# Patient Record
Sex: Male | Born: 1970 | Race: White | Hispanic: No | Marital: Single | Smoking: Never smoker
Health system: Southern US, Community
[De-identification: ages and names within clinical notes are randomized; demographics above are authoritative.]

## PROBLEM LIST (undated history)

## (undated) DIAGNOSIS — R42 Dizziness and giddiness: Principal | ICD-10-CM

## (undated) DIAGNOSIS — G610 Guillain-Barre syndrome: Secondary | ICD-10-CM

## (undated) DIAGNOSIS — F39 Unspecified mood [affective] disorder: Secondary | ICD-10-CM

## (undated) DIAGNOSIS — R51 Headache: Secondary | ICD-10-CM

## (undated) HISTORY — DX: Headache: R51

## (undated) HISTORY — DX: Guillain-Barre syndrome: G61.0

## (undated) HISTORY — PX: HERNIA REPAIR: SHX51

## (undated) HISTORY — DX: Dizziness and giddiness: R42

---

## 2001-11-17 ENCOUNTER — Encounter: Admission: RE | Admit: 2001-11-17 | Discharge: 2001-11-17 | Payer: Self-pay | Admitting: Neurosurgery

## 2001-11-17 ENCOUNTER — Encounter: Payer: Self-pay | Admitting: Neurosurgery

## 2001-12-01 ENCOUNTER — Encounter: Payer: Self-pay | Admitting: Neurosurgery

## 2001-12-01 ENCOUNTER — Encounter: Admission: RE | Admit: 2001-12-01 | Discharge: 2001-12-01 | Payer: Self-pay | Admitting: Neurosurgery

## 2002-01-17 ENCOUNTER — Encounter: Payer: Self-pay | Admitting: Neurosurgery

## 2002-01-17 ENCOUNTER — Encounter: Admission: RE | Admit: 2002-01-17 | Discharge: 2002-01-17 | Payer: Self-pay | Admitting: Neurosurgery

## 2004-10-09 ENCOUNTER — Encounter: Admission: RE | Admit: 2004-10-09 | Discharge: 2004-10-09 | Payer: Self-pay | Admitting: Neurosurgery

## 2004-11-04 ENCOUNTER — Encounter: Admission: RE | Admit: 2004-11-04 | Discharge: 2004-11-04 | Payer: Self-pay | Admitting: Neurosurgery

## 2015-02-16 ENCOUNTER — Emergency Department (HOSPITAL_COMMUNITY)
Admission: EM | Admit: 2015-02-16 | Discharge: 2015-02-17 | Disposition: A | Discharge status: Home / Self Care | Payer: BLUE CROSS/BLUE SHIELD | Attending: Emergency Medicine | Admitting: Emergency Medicine

## 2015-02-16 ENCOUNTER — Emergency Department (HOSPITAL_COMMUNITY): Payer: BLUE CROSS/BLUE SHIELD

## 2015-02-16 ENCOUNTER — Encounter (HOSPITAL_COMMUNITY): Payer: Self-pay | Admitting: *Deleted

## 2015-02-16 DIAGNOSIS — R42 Dizziness and giddiness: Secondary | ICD-10-CM

## 2015-02-16 DIAGNOSIS — Z79899 Other long term (current) drug therapy: Secondary | ICD-10-CM | POA: Diagnosis not present

## 2015-02-16 DIAGNOSIS — R079 Chest pain, unspecified: Secondary | ICD-10-CM | POA: Insufficient documentation

## 2015-02-16 HISTORY — DX: Unspecified mood (affective) disorder: F39

## 2015-02-16 LAB — CBC
HCT: 44.4 % (ref 39.0–52.0)
HEMOGLOBIN: 16 g/dL (ref 13.0–17.0)
MCH: 32.7 pg (ref 26.0–34.0)
MCHC: 36 g/dL (ref 30.0–36.0)
MCV: 90.6 fL (ref 78.0–100.0)
PLATELETS: 177 10*3/uL (ref 150–400)
RBC: 4.9 MIL/uL (ref 4.22–5.81)
RDW: 12.6 % (ref 11.5–15.5)
WBC: 8.7 10*3/uL (ref 4.0–10.5)

## 2015-02-16 LAB — URINALYSIS, ROUTINE W REFLEX MICROSCOPIC
Bilirubin Urine: NEGATIVE
Glucose, UA: NEGATIVE mg/dL
HGB URINE DIPSTICK: NEGATIVE
KETONES UR: NEGATIVE mg/dL
Leukocytes, UA: NEGATIVE
Nitrite: NEGATIVE
PROTEIN: NEGATIVE mg/dL
Specific Gravity, Urine: 1.012 (ref 1.005–1.030)
UROBILINOGEN UA: 0.2 mg/dL (ref 0.0–1.0)
pH: 6 (ref 5.0–8.0)

## 2015-02-16 LAB — CBG MONITORING, ED: GLUCOSE-CAPILLARY: 113 mg/dL — AB (ref 65–99)

## 2015-02-16 LAB — BASIC METABOLIC PANEL
ANION GAP: 8 (ref 5–15)
BUN: 15 mg/dL (ref 6–20)
CALCIUM: 9.4 mg/dL (ref 8.9–10.3)
CO2: 25 mmol/L (ref 22–32)
CREATININE: 0.99 mg/dL (ref 0.61–1.24)
Chloride: 105 mmol/L (ref 101–111)
GLUCOSE: 111 mg/dL — AB (ref 65–99)
Potassium: 4.3 mmol/L (ref 3.5–5.1)
Sodium: 138 mmol/L (ref 135–145)

## 2015-02-16 LAB — I-STAT TROPONIN, ED: Troponin i, poc: 0 ng/mL (ref 0.00–0.08)

## 2015-02-16 MED ORDER — DIAZEPAM 5 MG/ML IJ SOLN: 5.0000 mg | Freq: Once | INTRAMUSCULAR | Status: DC

## 2015-02-16 MED ORDER — MECLIZINE HCL 25 MG PO TABS
25.0000 mg | ORAL_TABLET | Freq: Once | ORAL | Status: DC
  Filled 2015-02-16: qty 1

## 2015-02-16 MED ORDER — SODIUM CHLORIDE 0.9 % IV BOLUS (SEPSIS)
500.0000 mL | Freq: Once | INTRAVENOUS | Status: AC
  Administered 2015-02-16: 500 mL via INTRAVENOUS

## 2015-02-16 MED ORDER — METHYLPREDNISOLONE SODIUM SUCC 125 MG IJ SOLR
125.0000 mg | Freq: Once | INTRAMUSCULAR | Status: AC
  Administered 2015-02-16: 125 mg via INTRAVENOUS
  Filled 2015-02-16: qty 2

## 2015-02-16 MED ORDER — LORAZEPAM 2 MG/ML IJ SOLN
1.0000 mg | Freq: Once | INTRAMUSCULAR | Status: AC
  Administered 2015-02-16: 1 mg via INTRAVENOUS
  Filled 2015-02-16: qty 1

## 2015-02-16 MED ORDER — ONDANSETRON 4 MG PO TBDP
4.0000 mg | ORAL_TABLET | Freq: Once | ORAL | Status: DC
  Filled 2015-02-16: qty 1

## 2015-02-16 MED ORDER — METOCLOPRAMIDE HCL 5 MG/ML IJ SOLN
10.0000 mg | Freq: Once | INTRAMUSCULAR | Status: AC
  Administered 2015-02-16: 10 mg via INTRAVENOUS
  Filled 2015-02-16: qty 2

## 2015-02-16 NOTE — ED Notes (Signed)
CBG was 113.

## 2015-02-16 NOTE — ED Notes (Signed)
PT has been treated by his pcp for vertigo.  He has had 3 significant episodes of dizziness lasting approximately 3 hours.  It seems like the episodes happen after he eats, then lays down to work under his car.  He begins to feel like the room is spinning, everything goes black for about 20 sec, and then if he lays still the dizziness slowly goes away.  Today he experienced bil lat rib pain and sternal chet pain he describes cramping. The has been taking antivert x 1 day and has not taken the lisinopril he was prescribed b/c he just bought it yesterday.  Pt has taken a total of 6  asa.  EMS stated initial pressure was 200/120 but then decereased to 155/92.

## 2015-02-17 MED ORDER — METOCLOPRAMIDE HCL 10 MG PO TABS
ORAL_TABLET | ORAL | Status: DC
Start: 2015-02-17 — End: 2015-04-29

## 2015-02-17 MED ORDER — LORAZEPAM 1 MG PO TABS: ORAL_TABLET | ORAL | Status: DC

## 2015-02-17 MED ORDER — ONDANSETRON 4 MG PO TBDP: ORAL_TABLET | ORAL | Status: DC

## 2015-02-17 NOTE — ED Notes (Signed)
Discharge instructions and prescriptions given, voiced understanding.  

## 2015-02-17 NOTE — Discharge Instructions (Signed)
Follow up with your md Monday or tuesday °

## 2015-02-18 NOTE — ED Provider Notes (Signed)
CSN: [id number]     Arrival date & time 02/16/15  1191 History   First MD Initiated Contact with Patient 02/16/15 2027     Chief Complaint  Patient presents with  . Dizziness  . Chest Pain     (Consider location/radiation/quality/duration/timing/severity/associated sxs/prior Treatment) Patient is a 44 y.o. male presenting with dizziness and chest pain. The history is provided by the patient (the pt has had 3 episodes in the last week where he has been having vertigo and passing out. pt still having vertigo and is taking antivert without help from pcp).  Dizziness Quality:  Head spinning and imbalance Severity:  Moderate Onset quality:  Sudden Timing:  Intermittent Chronicity:  New Context: bending over and head movement   Associated symptoms: chest pain   Associated symptoms: no diarrhea and no headaches   Chest Pain Associated symptoms: dizziness   Associated symptoms: no abdominal pain, no back pain, no cough, no fatigue and no headache     Past Medical History  Diagnosis Date  . Mood disorder    Past Surgical History  Procedure Laterality Date  . Hernia repair Right     inguinal   No family history on file. Social History  Substance Use Topics  . Smoking status: Never Smoker   . Smokeless tobacco: None  . Alcohol Use: Yes     Comment: occ    Review of Systems  Constitutional: Negative for appetite change and fatigue.  HENT: Negative for congestion, ear discharge and sinus pressure.   Eyes: Negative for discharge.  Respiratory: Negative for cough.   Cardiovascular: Positive for chest pain.  Gastrointestinal: Negative for abdominal pain and diarrhea.  Genitourinary: Negative for frequency and hematuria.  Musculoskeletal: Negative for back pain.  Skin: Negative for rash.  Neurological: Positive for dizziness. Negative for seizures and headaches.  Psychiatric/Behavioral: Negative for hallucinations.      Allergies  Review of patient's allergies indicates no  known allergies.  Home Medications   Prior to Admission medications   Medication Sig Start Date End Date Taking? Authorizing Provider  LORazepam (ATIVAN) 1 MG tablet Take one every 6 hours as needed for dizziness 02/17/15   Bethann Berkshire, MD  metoCLOPramide (REGLAN) 10 MG tablet Take one every 6 hour for dizziness 02/17/15   Bethann Berkshire, MD  ondansetron (ZOFRAN ODT) 4 MG disintegrating tablet  ODT q4 hours prn nausea/vomit 02/17/15   Bethann Berkshire, MD   BP 129/79 mmHg  Pulse 83  Temp(Src) 98.1 F (36.7 C) (Oral)  Resp 20  Ht 6' (1.829 m)  Wt 230 lb (104.327 kg)  BMI 31.19 kg/m2  SpO2 96% Physical Exam  Constitutional: He is oriented to person, place, and time. He appears well-developed.  HENT:  Head: Normocephalic.  No nystagmus,  Eyes: Conjunctivae and EOM are normal. No scleral icterus.  Neck: Neck supple. No thyromegaly present.  Cardiovascular: Normal rate and regular rhythm.  Exam reveals no gallop and no friction rub.   No murmur heard. Pulmonary/Chest: No stridor. He has no wheezes. He has no rales. He exhibits no tenderness.  Abdominal: He exhibits no distension. There is no tenderness. There is no rebound.  Musculoskeletal: Normal range of motion. He exhibits no edema.  Lymphadenopathy:    He has no cervical adenopathy.  Neurological: He is alert and oriented to person, place, and time. He exhibits normal muscle tone. Coordination normal.  When pt gets up he complains of his head spinning  Skin: No rash noted. No erythema.  Psychiatric: He  has a normal mood and affect. His behavior is normal.    ED Course  Procedures (including critical care time) Labs Review Labs Reviewed  BASIC METABOLIC PANEL - Abnormal; Notable for the following:    Glucose, Bld 111 (*)    All other components within normal limits  CBG MONITORING, ED - Abnormal; Notable for the following:    Glucose-Capillary 113 (*)    All other components within normal limits  CBC  URINALYSIS,  ROUTINE W REFLEX MICROSCOPIC (NOT AT [facility])  Rosezena Sensor, ED    Imaging Review Mr Brain Wo Contrast  02/16/2015   CLINICAL DATA:  Vertigo. Three significant episodes of dizziness lasting 3 hours, typically postprandial. On Antivert for 1 day. Hypertensive.  EXAM: MRI HEAD WITHOUT CONTRAST  TECHNIQUE: Multiplanar, multiecho pulse sequences of the brain and surrounding structures were obtained without intravenous contrast.  COMPARISON:  CT sinuses July 27, 2007  FINDINGS: The ventricles and sulci are normal for patient's age. No abnormal parenchymal signal, mass lesions, mass effect. No reduced diffusion to suggest acute ischemia. No susceptibility artifact to suggest hemorrhage.  No abnormal extra-axial fluid collections. No extra-axial masses though, contrast enhanced sequences would be more sensitive. Normal major intracranial vascular flow voids seen at the skull base. RIGHT brachium pontis developmental venous anomaly, benign finding.  Ocular globes and orbital contents are unremarkable though not tailored for evaluation. No abnormal sellar expansion. Visualized paranasal sinuses and mastoid air cells are well-aerated. Bilateral concha bullosa. No suspicious calvarial bone marrow signal. No abnormal sellar expansion. Craniocervical junction maintained.  IMPRESSION: No acute intracranial process.  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 Otherwise unremarkable noncontrast MRI of the brain.   Electronically Signed   By: Awilda Metro M.D.   On: 02/16/2015 22:34   I have personally reviewed and evaluated these images and lab results as part of my medical decision-making.   EKG Interpretation   Date/Time:  Saturday February 16 2015 18:47:42 EDT Ventricular Rate:  89 PR Interval:  156 QRS Duration: 88 QT Interval:  366 QTC Calculation: 445 R Axis:   0 Text Interpretation:  Normal sinus rhythm with sinus arrhythmia Normal ECG  Confirmed by Milee Qualls  MD, Jomarie Longs  [phone]) on 02/16/2015 8:28:29 PM     I spoke with hospitalist about obs admission,  But it was recommended to tx as out pt with ativan and reglan with pcp and possibly ent follow up MDM   Final diagnoses:  Vertigo    Vertigo,  Mri head ,  Neg  Labs unremarkable,   tx with ativan and reglan in er,  Minimal improvement,  Pt will contiue the ativan and reglan as out pt and follow up with pcp,     Bethann Berkshire, MD 02/18/15 1127

## 2015-02-25 ENCOUNTER — Encounter: Payer: Self-pay | Admitting: Physical Therapy

## 2015-02-25 ENCOUNTER — Ambulatory Visit: Payer: BLUE CROSS/BLUE SHIELD | Attending: Otolaryngology | Admitting: Physical Therapy

## 2015-02-25 DIAGNOSIS — H8111 Benign paroxysmal vertigo, right ear: Secondary | ICD-10-CM | POA: Insufficient documentation

## 2015-02-25 NOTE — Patient Instructions (Signed)
Epley Maneuver Self-Care WHAT IS THE EPLEY MANEUVER? The Epley maneuver is an exercise you can do to relieve symptoms of benign paroxysmal positional vertigo (BPPV). This condition is often just referred to as vertigo. BPPV is caused by the movement of tiny crystals (canaliths) inside your inner ear. The accumulation and movement of canaliths in your inner ear causes a sudden spinning sensation (vertigo) when you move your head to certain positions. Vertigo usually lasts about 30 seconds. BPPV usually occurs in just one ear. If you get vertigo when you lie on your left side, you probably have BPPV in your left ear. Your health care provider can tell you which ear is involved.  BPPV may be caused by a head injury. Many people older than 50 get BPPV for unknown reasons. If you have been diagnosed with BPPV, your health care provider may teach you how to do this maneuver. BPPV is not life threatening (benign) and usually goes away in time.  WHEN SHOULD I PERFORM THE EPLEY MANEUVER? You can do this maneuver at home whenever you have symptoms of vertigo. You may do the Epley maneuver up to 3 times a day until your symptoms of vertigo go away. HOW SHOULD I DO THE EPLEY MANEUVER? 1. Sit on the edge of a bed or table with your back straight. Your legs should be extended or hanging over the edge of the bed or table.  2. Turn your head halfway toward the affected ear.  3. Lie backward quickly with your head turned until you are lying flat on your back. You may want to position a pillow under your shoulders.  4. Hold this position for 30 seconds. You may experience an attack of vertigo. This is normal. Hold this position until the vertigo stops. 5. Then turn your head to the opposite direction until your unaffected ear is facing the floor.  6. Hold this position for 30 seconds. You may experience an attack of vertigo. This is normal. Hold this position until the vertigo stops. 7. Now turn your whole body to  the same side as your head. Hold for another 30 seconds.  8. You can then sit back up. ARE THERE RISKS TO THIS MANEUVER? In some cases, you may have other symptoms (such as changes in your vision, weakness, or numbness). If you have these symptoms, stop doing the maneuver and call your health care provider. Even if doing these maneuvers relieves your vertigo, you may still have dizziness. Dizziness is the sensation of light-headedness but without the sensation of movement. Even though the Epley maneuver may relieve your vertigo, it is possible that your symptoms will return within 5 years. WHAT SHOULD I DO AFTER THIS MANEUVER? After doing the Epley maneuver, you can return to your normal activities. Ask your doctor if there is anything you should do at home to prevent vertigo. This may include:  Sleeping with two or more pillows to keep your head elevated.  Not sleeping on the side of your affected ear.  Getting up slowly from bed.  Avoiding sudden movements during the day.  Avoiding extreme head movement, like looking up or bending over.  Wearing a cervical collar to prevent sudden head movements. WHAT SHOULD I DO IF MY SYMPTOMS GET WORSE? Call your health care provider if your vertigo gets worse. Call your provider right way if you have other symptoms, including:   Nausea.  Vomiting.  Headache.  Weakness.  Numbness.  Vision changes. Document Released: 06/20/2013 Document Reviewed: 06/20/2013 ExitCare   Patient Information 2015 [employer]. This information is not intended to replace advice given to you by your health care provider. Make sure you discuss any questions you have with your health care provider. Benign Positional Vertigo Vertigo means you feel like you or your surroundings are moving when they are not. Benign positional vertigo is the most common form of vertigo. Benign means that the cause of your condition is not serious. Benign positional vertigo is more common  in older adults. CAUSES  Benign positional vertigo is the result of an upset in the labyrinth system. This is an area in the middle ear that helps control your balance. This may be caused by a viral infection, head injury, or repetitive motion. However, often no specific cause is found. SYMPTOMS  Symptoms of benign positional vertigo occur when you move your head or eyes in different directions. Some of the symptoms may include: 9. Loss of balance and falls. 10. Vomiting. 11. Blurred vision. 12. Dizziness. 13. Nausea. 14. Involuntary eye movements (nystagmus). DIAGNOSIS  Benign positional vertigo is usually diagnosed by physical exam. If the specific cause of your benign positional vertigo is unknown, your caregiver may perform imaging tests, such as magnetic resonance imaging (MRI) or computed tomography (CT). TREATMENT  Your caregiver may recommend movements or procedures to correct the benign positional vertigo. Medicines such as meclizine, benzodiazepines, and medicines for nausea may be used to treat your symptoms. In rare cases, if your symptoms are caused by certain conditions that affect the inner ear, you may need surgery. HOME CARE INSTRUCTIONS   Follow your caregiver's instructions.  Move slowly. Do not make sudden body or head movements.  Avoid driving.  Avoid operating heavy machinery.  Avoid performing any tasks that would be dangerous to you or others during a vertigo episode.  Drink enough fluids to keep your urine clear or pale yellow. SEEK IMMEDIATE MEDICAL CARE IF:   You develop problems with walking, weakness, numbness, or using your arms, hands, or legs.  You have difficulty speaking.  You develop severe headaches.  Your nausea or vomiting continues or gets worse.  You develop visual changes.  Your family or friends notice any behavioral changes.  Your condition gets worse.  You have a fever.  You develop a stiff neck or sensitivity to light. MAKE  SURE YOU:   Understand these instructions.  Will watch your condition.  Will get help right away if you are not doing well or get worse. Document Released: 03/23/2006 Document Revised: 09/07/2011 Document Reviewed: 03/05/2011 ExitCare Patient Information 2015 [employer]. This information is not intended to replace advice given to you by your health care provider. Make sure you discuss any questions you have with your health care provider. 

## 2015-02-25 NOTE — Therapy (Signed)
[facility] [address] [place], [place], [address] Phone: [phone]   Fax:  [phone]  Physical Therapy Evaluation  Patient Details  Name: Ronald Campbell MRN: [id number] Date of Birth: 1971/01/17 Referring Provider:  Osborn Coho, MD  Encounter Date: 02/25/2015      PT End of Session - 02/25/15 1350    Visit Number 1   Number of Visits 3   Date for PT Re-Evaluation 03/27/15   Authorization Type BCBS   PT Start Time 0800   PT Stop Time 0847   PT Time Calculation (min) 47 min      Past Medical History  Diagnosis Date  . Mood disorder     Past Surgical History  Procedure Laterality Date  . Hernia repair Right     inguinal    There were no vitals filed for this visit.  Visit Diagnosis:  BPPV (benign paroxysmal positional vertigo), right - Plan: PT plan of care cert/re-cert      Subjective Assessment - 02/25/15 1316    Subjective Pt. 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); states he went home and lied down and drank lots of fluid, thinking it was heat exhaustion; reports he went back to work on following Mon, got sick again and went to his primary MD on Monday, 02-11-15 -  stayed out of work that week; went back to MD on Thurs, 8-18 due to not feling better and still having vertigo - MD diagnosed him with heat exhaustion or vertigo - states he was prescibed Meclizine and a BP medication which he has not taken; vertigo became worse on Sat., 02-16-15 and he went [facility] ED via EMS with BP 195/150 - diagnosed with vertigo as MRI was negative;  pt states he saw Dr. Annalee Genta on 02-21-15                                                                                                                                                                                                                         Patient is accompained by: Family member  father   Pertinent History HTN   Patient Stated Goals Resolve the vertigo   Currently in Pain? No/denies            [facility] PT Assessment -  02/25/15 0816    Assessment   Medical Diagnosis Vertigo   Onset Date/Surgical Date 02/08/15   Prior Therapy none   Precautions   Precautions Other (comment)  vertigo - unsteady gait depending on severity   Balance Screen   Has the patient fallen in the past 6 months No   Has the patient had a decrease in activity level because of a fear of falling?  No   Is the patient reluctant to leave their home because of a fear of falling?  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            Vestibular Assessment - 02/25/15 0820    Vestibular Assessment   General Observation pt is a 44 yr old male ambulating independently without a device   Symptom Behavior   Type of Dizziness Spinning   Frequency of Dizziness varies depending on the positons assumed   Duration of Dizziness 4 hrs at initial onset   Aggravating Factors Looking up to the ceiling;Lying supine;Rolling to right   Relieving Factors Closing eyes;Lying supine   Occulomotor Exam   Occulomotor Alignment Normal   Spontaneous Absent   Gaze-induced Absent   Smooth Pursuits Intact   Saccades Intact   Positional Testing   Sidelying Test Sidelying Right;Sidelying Left   Dix-Hallpike Right   Dix-Hallpike Right Duration approx. 23 secs   Dix-Hallpike Right Symptoms No nystagmus;Left nystagmus;Other (comment)  c/o vertigo in test position   Dix-Hallpike Left   Dix-Hallpike Left Duration approx. 10 secs   Dix-Hallpike Left Symptoms No nystagmus  c/o mild vertigo in test position   Sidelying Right   Sidelying Right Duration moderate c/o vertigo bu no nystagmus in  position   Sidelying Left   Sidelying Left Duration mild c/o vertigo but no nystagmus   Positional Sensitivities   Up from Right Hallpike Mild dizziness   Up from Left Hallpike Lightheadedness                       PT Education - 02/25/15 1348    Education provided Yes   Education Details epley maneuver for self treatment of R BPPV; info on BPPV (etiology)   Person(s) Educated Patient   Methods Explanation;Demonstration   Comprehension Verbalized understanding;Returned demonstration             PT Long Term Goals - 02/25/15 1712    PT LONG TERM GOAL #1   Title Pt. will have a negative R Dix-Hallpike test, i.e. no c/o vertigo in test position, to indicate that R BPPV has resolved. (03-27-15)   Time 4   Period Weeks   Status New   PT LONG TERM GOAL #2   Title Improve Dizziness Handicap Index to </= 30 to indicate improved function/less vertigo.  (03-27-15)   Time 4   Period Weeks   Status New   PT LONG TERM GOAL #3   Title Independent in HEP for habituation if needed, i.e. Brandt-Daroff exercises.  (03-27-15)   Time 4   Period Weeks   Status New               Plan - 02/25/15 1351    Clinical Impression Statement Pt. has symptoms consistent with R BPPV, however, no nystagmus noted in any test position; pt reported improvement with Epley's maneuver after 1st rep - at end of session pt.  stated he felt much better; I strongly advised and recommended that he take his BP medication as BP very high this am                                                                                                                                    Pt will benefit from skilled therapeutic intervention in order to improve on the following deficits Dizziness   Rehab Potential Good   PT Frequency 2x / week   PT Duration 2 weeks   PT Treatment/Interventions ADLs/Self Care Home Management;Canalith Repostioning;Functional mobility training;Gait training;Therapeutic  activities;Therapeutic exercise;Balance training;Neuromuscular re-education;Patient/family education;Vestibular   PT Next Visit Plan recheck R Dix-Hallpike; do Epley's maneuver prn   PT Home Exercise Plan Brandt-Daroff exercises prn   Consulted and Agree with Plan of Care Patient;Family member/caregiver   Family Member Consulted father         Problem List There are no active problems to display for this patient.   ZOXWRU, EAVWU [id number], PT 02/25/2015, 5:20 PM  [facility] [address] [place], [place], [address] Phone: [phone]   Fax:  [phone]

## 2015-02-28 ENCOUNTER — Ambulatory Visit: Payer: BLUE CROSS/BLUE SHIELD | Attending: Otolaryngology | Admitting: Physical Therapy

## 2015-02-28 VITALS — BP 129/88

## 2015-02-28 DIAGNOSIS — H8112 Benign paroxysmal vertigo, left ear: Secondary | ICD-10-CM | POA: Diagnosis present

## 2015-02-28 DIAGNOSIS — R2681 Unsteadiness on feet: Secondary | ICD-10-CM | POA: Diagnosis present

## 2015-02-28 DIAGNOSIS — H8111 Benign paroxysmal vertigo, right ear: Secondary | ICD-10-CM | POA: Diagnosis present

## 2015-03-01 ENCOUNTER — Encounter: Payer: Self-pay | Admitting: Physical Therapy

## 2015-03-01 NOTE — Patient Instructions (Signed)
Sit to Side-Lying   Sit on edge of bed. 1. Turn head 45 to right. 2. Maintain head position and lie down slowly on left side. Hold until symptoms subside. 3. Sit up slowly. Hold until symptoms subside. 4. Turn head 45 to left. 5. Maintain head position and lie down slowly on right side. Hold until symptoms subside. 6. Sit up slowly. Repeat sequence _5___ times per session. Do __3-5__ sessions per day.  Copyright  VHI. All rights reserved.   

## 2015-03-01 NOTE — Therapy (Signed)
[facility] [address] [place], [place], [address] Phone: [phone]   Fax:  [phone]  Physical Therapy Treatment  Patient Details  Name: Ronald Campbell MRN: [id number] Date of Birth: Jul 05, 1970 Referring Provider:  Gildardo Cranker, MD  Encounter Date: 02/28/2015      PT End of Session - 03/01/15 1120    Visit Number 2   Number of Visits 3   Date for PT Re-Evaluation 03/27/15   Authorization Type BCBS   PT Start Time 0805   PT Stop Time 0846   PT Time Calculation (min) 41 min      Past Medical History  Diagnosis Date  . Mood disorder     Past Surgical History  Procedure Laterality Date  . Hernia repair Right     inguinal    Filed Vitals:   02/28/15 0807  BP: 129/88    Visit Diagnosis:  BPPV (benign paroxysmal positional vertigo), left      Subjective Assessment - 03/01/15 1115    Subjective Pt. reports much improvement with vertigo since eval and treatment on Mon, 02-25-15; reports he did experience some vertigo on Wed. while bending over and returning  to standing position   Pertinent History HTN   Patient Stated Goals Resolve the vertigo   Currently in Pain? No/denies      (+) L Dix-Hallpike test with c/o vertigo in test position but no nystagmus noted  (-) R Dix-Hallpike test with no nystagmus and no c/o vertigo in test position  Epley maneuver performed 3 reps for L BPPV with pt reporting very min to no vertigo on 3rd rep   Pt does not feel that another appt is needed at this time - will call back to schedule if needed                           PT Education - 03/01/15 1119    Education provided Yes   Education Details Brandt-Daroff exercises prn   Person(s) Educated Patient   Methods Explanation;Demonstration;Handout   Comprehension Verbalized understanding;Returned demonstration             PT Long Term Goals - 02/25/15 1712    PT LONG TERM GOAL #1   Title Pt. will have a negative R Dix-Hallpike test, i.e. no c/o vertigo in test position, to indicate that R BPPV has resolved. (03-27-15)   Time 4   Period Weeks   Status New   PT LONG TERM GOAL #2   Title Improve Dizziness Handicap Index to </= 30 to indicate improved function/less vertigo.  (03-27-15)   Time 4   Period Weeks   Status New   PT LONG TERM GOAL #3   Title Independent in HEP for habituation if needed, i.e. Brandt-Daroff exercises.  (03-27-15)   Time 4   Period Weeks   Status New               Plan - 03/01/15 1120    Clinical Impression Statement Pt. had symptoms consistent with L BPPV today (had R BPPV on 02-25-15); min. to no symptoms reported after 3 reps of Epley maneuver for L BPPV   Pt will benefit from skilled therapeutic intervention in order to improve on the following deficits Dizziness   Rehab Potential Good   PT Frequency 2x / week   PT Duration 2 weeks   PT Treatment/Interventions ADLs/Self Care Home Management;Canalith Repostioning;Functional mobility training;Gait training;Therapeutic activities;Therapeutic exercise;Balance training;Neuromuscular re-education;Patient/family  education;Vestibular   PT Next Visit Plan recheck L Dix-Hallpike; do Epley's maneuver prn   PT Home Exercise Plan Brandt-Daroff exercises prn   Consulted and Agree with Plan of Care Patient        Problem List There are no active problems to display for this patient.   Kary Kos, PT 03/01/2015, 11:24 AM  [facility] [address] [place], [place], [address] Phone: [phone]   Fax:  [phone]

## 2015-03-05 ENCOUNTER — Ambulatory Visit: Payer: BLUE CROSS/BLUE SHIELD | Admitting: Physical Therapy

## 2015-03-05 DIAGNOSIS — H8112 Benign paroxysmal vertigo, left ear: Secondary | ICD-10-CM

## 2015-03-05 DIAGNOSIS — R2681 Unsteadiness on feet: Secondary | ICD-10-CM

## 2015-03-08 ENCOUNTER — Encounter: Payer: Self-pay | Admitting: Physical Therapy

## 2015-03-08 NOTE — Patient Instructions (Signed)
Feet Apart (Compliant Surface) Arm Motion - Eyes Closed   Stand on compliant surface: ____pillow____, feet shoulder width apart. Close eyes and move arms up and down: to front. Repeat _2___ times per session. Do __2__ sessions per day.  Copyright  VHI. All rights reserved.  Balance: Eyes Open - Bilateral (Varied Surfaces)   Stand, feet shoulder width, eyes open. Maintain balance _45___ seconds. Repeat __2__ times per set. Do _2___ sets per session. Do ___5_ sessions per week. Repeat on compliant surface: foam.  Copyright  VHI. All rights reserved.  Gaze Stabilization: Tip Card 1.Target must remain in focus, not blurry, and appear stationary while head is in motion. 2.Perform exercises with small head movements (45 to either side of midline). 3.Increase speed of head motion so long as target is in focus. 4.If you wear eyeglasses, be sure you can see target through lens (therapist will give specific instructions for bifocal / progressive lenses). 5.These exercises may provoke dizziness or nausea. Work through these symptoms. If too dizzy, slow head movement slightly. Rest between each exercise. 6.Exercises demand concentration; avoid distractions. 7.For safety, perform standing exercises close to a counter, wall, corner, or next to someone.  Copyright  VHI. All rights reserved.  Gaze Stabilization: Standing Feet Apart (Compliant Surface)   Feet apart on pillow, keeping eyes on target on wall _6___ feet away, tilt head down 15-30 and move head side to side for _60___ seconds. Repeat while moving head up and down for __60__ seconds. Do _3-5___ sessions per day. Repeat using target on pattern background.  Copyright  VHI. All rights reserved.

## 2015-03-08 NOTE — Therapy (Signed)
[facility] [address] [place], [place], [address] Phone: [phone]   Fax:  [phone]  Physical Therapy Treatment  Patient Details  Name: Ronald Campbell MRN: [id number] Date of Birth: 24-Apr-1971 Referring Provider:  Gildardo Cranker, MD  Encounter Date: 03/05/2015      PT End of Session - 03/08/15 0922    Visit Number 3   Number of Visits 4   Date for PT Re-Evaluation 03/27/15   Authorization Type BCBS   PT Start Time 1620   PT Stop Time 1659   PT Time Calculation (min) 39 min      Past Medical History  Diagnosis Date  . Mood disorder     Past Surgical History  Procedure Laterality Date  . Hernia repair Right     inguinal    There were no vitals filed for this visit.  Visit Diagnosis:  BPPV (benign paroxysmal positional vertigo), left  Unsteadiness      Subjective Assessment - 03/08/15 0913    Subjective Pt. states he is still having some dizziness - is out of work this week to continue recovery and avoid having to work in the heat due to fear of another attack occurring   Pertinent History HTN   Patient Stated Goals Resolve the vertigo   Currently in Pain? No/denies                          Vestibular Treatment/Exercise - 03/08/15 0001    Vestibular Treatment/Exercise   Vestibular Treatment Provided Canalith Repositioning   Gaze Exercises X1 Viewing Horizontal;X1 Viewing Vertical    EPLEY MANUEVER LEFT   Number of Reps  3   Overall Response  Improved Symptoms   X1 Viewing Horizontal   Foot Position bilateral stance   Time --  30 secs   Reps 1   Comments c/o mild symptoms   X1 Viewing Vertical   Foot Position bilateral   Time --  30 secs   Reps 1   Comments minimal symptoms     Dynamic visual acuity test - 2 line difference from static visual acuity but c/o symptoms with testing  Pt. Performed gaze stabilization in standing x 30 secs horizontal and  vertical  Performed balance on foam with feet apart and together with EO and EC x 30 secs each position - instructed In these exercises for HEP          PT Education - 03/08/15 0921    Education provided Yes   Education Details gaze stabilization and balance on foam   Person(s) Educated Patient   Methods Explanation;Demonstration;Handout   Comprehension Verbalized understanding;Returned demonstration             PT Long Term Goals - 02/25/15 1712    PT LONG TERM GOAL #1   Title Pt. will have a negative R Dix-Hallpike test, i.e. no c/o vertigo in test position, to indicate that R BPPV has resolved. (03-27-15)   Time 4   Period Weeks   Status New   PT LONG TERM GOAL #2   Title Improve Dizziness Handicap Index to </= 30 to indicate improved function/less vertigo.  (03-27-15)   Time 4   Period Weeks   Status New   PT LONG TERM GOAL #3   Title Independent in HEP for habituation if needed, i.e. Brandt-Daroff exercises.  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 balance noted in situations requiring incr. vestibular input   Pt will benefit from skilled therapeutic intervention in order to improve on the following deficits Dizziness;Decreased balance   Rehab Potential Good   PT Frequency 2x / week   PT Duration 3 weeks   PT Treatment/Interventions ADLs/Self Care Home Management;Canalith Repostioning;Functional mobility training;Gait training;Therapeutic activities;Therapeutic exercise;Balance training;Neuromuscular re-education;Patient/family education;Vestibular   PT Next Visit Plan L Dix-Hallpike prn; check HEP   PT Home Exercise Plan Brandt-Daroff exercises prn; VOR x1, balance on foam   Consulted and Agree with Plan of Care Patient         Problem List There are no active problems to display for this patient.   Kary Kos, PT 03/08/2015, 9:26 AM  [facility] [address] [place], [place], [address] Phone: [phone]   Fax:  [phone]

## 2015-03-13 ENCOUNTER — Ambulatory Visit: Payer: BLUE CROSS/BLUE SHIELD | Admitting: Physical Therapy

## 2015-03-14 ENCOUNTER — Ambulatory Visit: Payer: BLUE CROSS/BLUE SHIELD | Admitting: Physical Therapy

## 2015-03-14 DIAGNOSIS — H8112 Benign paroxysmal vertigo, left ear: Secondary | ICD-10-CM

## 2015-03-14 DIAGNOSIS — R2681 Unsteadiness on feet: Secondary | ICD-10-CM

## 2015-03-16 ENCOUNTER — Encounter: Payer: Self-pay | Admitting: Physical Therapy

## 2015-03-16 NOTE — Therapy (Signed)
[facility] [address] [place], [place], [address] Phone: [phone]   Fax:  [phone]  Physical Therapy Treatment  Patient Details  Name: Ronald Campbell MRN: [id number] Date of Birth: 07-07-1970 Referring Albion Weatherholtz:  Gildardo Cranker, MD  Encounter Date: 03/14/2015      PT End of Session - 03/16/15 2147    Visit Number 4   Number of Visits 5   Date for PT Re-Evaluation 03/27/15   Authorization Type BCBS   PT Start Time 0800   PT Stop Time 0845   PT Time Calculation (min) 45 min      Past Medical History  Diagnosis Date  . Mood disorder     Past Surgical History  Procedure Laterality Date  . Hernia repair Right     inguinal    There were no vitals filed for this visit.  Visit Diagnosis:  BPPV (benign paroxysmal positional vertigo), left  Unsteadiness      Subjective Assessment - 03/16/15 2143    Subjective Pt. states he is much better than he was but still has some dizziness with quick head turns; reports he did return to work this week   Pertinent History HTN   Patient Stated Goals Resolve the vertigo   Currently in Pain? No/denies                              Balance Exercises - 03/16/15 2144    Balance Exercises: Standing   Standing Eyes Closed Narrow base of support (BOS);Wide (BOA);Head turns;Foam/compliant surface;2 reps;10 secs   Balance Exercises: Supine   Head Turns standing - 10 reps horizontally           PT Education - 03/16/15 2146    Education Details progressed gaze stabilization to standing on foam   Person(s) Educated Patient   Methods Explanation;Demonstration   Comprehension Verbalized understanding     Sensory Organization Test; WNL's for composite score with all trials except 2 being WNL's; all inputs WNL's  Dynamic visual acuity 2 line difference with no c/o vertigo after testing completed - this is improvement from previous testing         PT Long Term Goals - 02/25/15 1712    PT LONG TERM GOAL #1   Title Pt. will have a negative R Dix-Hallpike test, i.e. no c/o vertigo in test position, to indicate that R BPPV has resolved. (03-27-15)   Time 4   Period Weeks   Status New   PT LONG TERM GOAL #2   Title Improve Dizziness Handicap Index to </= 30 to indicate improved function/less vertigo.  (03-27-15)   Time 4   Period Weeks   Status New   PT LONG TERM GOAL #3   Title Independent in HEP for habituation if needed, i.e. Brandt-Daroff exercises.  (03-27-15)   Time 4   Period Weeks   Status New               Plan - 03/16/15 2147    Clinical Impression Statement SOT score WNL's for composite score with vestibullar WNL's; pt cont to report some mild dizziness with sit to L sidelying but no nystagmus observed   Pt will benefit from skilled therapeutic intervention in order to improve on the following deficits Dizziness;Decreased balance   Rehab Potential Good   PT Frequency 1x / week   PT Duration 4 weeks   PT Treatment/Interventions ADLs/Self Care Home Management;Canalith Repostioning;Functional  mobility training;Gait training;Therapeutic activities;Therapeutic exercise;Balance training;Neuromuscular re-education;Patient/family education;Vestibular   PT Next Visit Plan check HEP and D/C   PT Home Exercise Plan Brandt-Daroff exercises prn; VOR x1, balance on foam   Consulted and Agree with Plan of Care Patient        Problem List There are no active problems to display for this patient.   Kary Kos, PT 03/16/2015, 9:50 PM  [facility] [address] [place], [place], [address] Phone: [phone]   Fax:  [phone]

## 2015-03-28 ENCOUNTER — Ambulatory Visit: Payer: BLUE CROSS/BLUE SHIELD | Admitting: Physical Therapy

## 2015-03-28 DIAGNOSIS — H8112 Benign paroxysmal vertigo, left ear: Secondary | ICD-10-CM | POA: Diagnosis not present

## 2015-03-28 DIAGNOSIS — H8111 Benign paroxysmal vertigo, right ear: Secondary | ICD-10-CM

## 2015-03-28 DIAGNOSIS — R2681 Unsteadiness on feet: Secondary | ICD-10-CM

## 2015-03-31 ENCOUNTER — Encounter: Payer: Self-pay | Admitting: Physical Therapy

## 2015-03-31 NOTE — Patient Instructions (Signed)
Sit to Side-Lying    Sit on edge of bed. 1. Turn head 45 to right. 2. Maintain head position and lie down slowly on left side. Hold until symptoms subside. 3. Sit up slowly. Hold until symptoms subside. 4. Turn head 45 to left. 5. Maintain head position and lie down slowly on right side. Hold until symptoms subside. 6. Sit up slowly. Repeat sequence _5___ times per session. Do __3__ sessions per day.  Copyright  VHI. All rights reserved.   

## 2015-03-31 NOTE — Therapy (Signed)
[facility] [address] [place], [place], [address] Phone: [phone]   Fax:  [phone]  Physical Therapy Treatment  Patient Details  Name: Ronald Campbell MRN: [id number] Date of Birth: [birth year] Referring Provider:  Gildardo Cranker, MD  Encounter Date: 03/28/2015      PT End of Session - 03/31/15 1035    Visit Number 5   Number of Visits 6   Date for PT Re-Evaluation 04/05/15   Authorization Type BCBS   PT Start Time 1447   PT Stop Time 1535   PT Time Calculation (min) 48 min      Past Medical History  Diagnosis Date  . Mood disorder [facility])     Past Surgical History  Procedure Laterality Date  . Hernia repair Right     inguinal    There were no vitals filed for this visit.  Visit Diagnosis:  BPPV (benign paroxysmal positional vertigo), right  Unsteadiness      Subjective Assessment - 03/31/15 1024    Subjective Pt. reports he still has some dizziness but states it is significantly improved from what it was initially; has returned to work full-time   Pertinent History HTN   Patient Stated Goals Resolve the vertigo   Currently in Pain? No/denies                          Vestibular Treatment/Exercise - 03/31/15 0001    Vestibular Treatment/Exercise   Vestibular Treatment Provided Canalith Repositioning    EPLEY MANUEVER RIGHT   Number of Reps  3   Overall Response Improved Symptoms   Response Details  no nystagmus observed       Positioning testing - R and L sidelying with c/o vertigo with R sidelying and turning head toward right side Rolling supine to right side, then onto left side x 3 reps Simulated position at work that provokes vertigo - lying down and turning head toward right side Reviewed balance on foam exercises - pt verbalized understanding       PT Education - 03/31/15 1034    Education provided Yes   Education Details sit to R sidelying with right head turns  for habituation   Person(s) Educated Patient   Methods Explanation;Demonstration;Handout   Comprehension Verbalized understanding;Returned demonstration             PT Long Term Goals - 02/25/15 1712    PT LONG TERM GOAL #1   Title Pt. will have a negative R Dix-Hallpike test, i.e. no c/o vertigo in test position, to indicate that R BPPV has resolved. (03-27-15)   Time 4   Period Weeks   Status New   PT LONG TERM GOAL #2   Title Improve Dizziness Handicap Index to </= 30 to indicate improved function/less vertigo.  (03-27-15)   Time 4   Period Weeks   Status New   PT LONG TERM GOAL #3   Title Independent in HEP for habituation if needed, i.e. Brandt-Daroff exercises.  (03-27-15)   Time 4   Period Weeks   Status New               Plan - 03/31/15 1036    Clinical Impression Statement Pt.'s symptoms cont. to be consistent and sugggestive of R BPPV; pt reports vertigo with lying down and turning head toward right side but no nystagmus observed   Pt will benefit from skilled therapeutic intervention in order to improve on the following  deficits Dizziness;Decreased balance   Rehab Potential Good   PT Frequency 1x / week   PT Duration 4 weeks   PT Treatment/Interventions ADLs/Self Care Home Management;Canalith Repostioning;Functional mobility training;Gait training;Therapeutic activities;Therapeutic exercise;Balance training;Neuromuscular re-education;Patient/family education;Vestibular   PT Next Visit Plan check HEP and D/C   PT Home Exercise Plan habituation for sit to right sidelying with head turn toward R side   Consulted and Agree with Plan of Care Patient        Problem List There are no active problems to display for this patient.   Kary Kos, PT 03/31/2015, 10:53 AM  [facility] [address] [place], [place], [address] Phone: [phone]   Fax:  [phone]

## 2015-04-09 ENCOUNTER — Ambulatory Visit: Payer: BLUE CROSS/BLUE SHIELD | Attending: Otolaryngology | Admitting: Physical Therapy

## 2015-04-09 DIAGNOSIS — H8111 Benign paroxysmal vertigo, right ear: Secondary | ICD-10-CM | POA: Diagnosis not present

## 2015-04-09 DIAGNOSIS — R2681 Unsteadiness on feet: Secondary | ICD-10-CM | POA: Diagnosis present

## 2015-04-12 ENCOUNTER — Encounter: Payer: Self-pay | Admitting: Physical Therapy

## 2015-04-12 NOTE — Therapy (Signed)
[place] [address] [place], [place], [address] Phone: [phone]   Fax:  [phone]  Physical Therapy Treatment  Patient Details  Name: Ronald Campbell MRN: [id number] Date of Birth: 1971/01/20 No Data Recorded  Encounter Date: 04/09/2015      PT End of Session - 04/12/15 0658    Visit Number 6   Number of Visits 6   Date for PT Re-Evaluation 04/05/15   Authorization Type BCBS   PT Start Time 0802   PT Stop Time 0846   PT Time Calculation (min) 44 min      Past Medical History  Diagnosis Date  . Mood disorder [facility])     Past Surgical History  Procedure Laterality Date  . Hernia repair Right     inguinal    There were no vitals filed for this visit.  Visit Diagnosis:  BPPV (benign paroxysmal positional vertigo), right  Unsteadiness      Subjective Assessment - 04/12/15 0656    Subjective Pt reports much improvement in the vertigo - still has it occasionally but much less frequent than it used to be   Pertinent History HTN   Patient Stated Goals Resolve the vertigo   Currently in Pain? No/denies          NeuroRe-ed: positional testing for vertigo - R Dix-Hallpike test negative for nystagmus but some mild c/o vertigo which Lasted only 2 - 3 secs; negative L Dix-Hallpike test with no nystagmus and no c/o vertigo Rolling supine to Right to left sides - no vertigo provoked Simulated required transitional movements performed at work to provoke vertigo - sit to L sidelying with head turned toward R side -- no vertigo provoked with this position today  Reviewed HEP - recommended pt to continue with balance on foam exercises to incr. Balance and vestibular input   Dizziness Handicap Index test score 10% - much improved from initial score of 72%   Discussed LTG's and progress - plan is to place pt on hold for 1 month to determine if vertigo re-occurs with pt instructed To call for appt -- if not,  will discharge                    PT Education - 04/12/15 0658    Education provided Yes   Education Details continue with balance on foam and habituation exercises as previously instructed   Person(s) Educated Patient   Methods Explanation;Demonstration   Comprehension Verbalized understanding             PT Long Term Goals - 04/12/15 0702    PT LONG TERM GOAL #1   Title Pt. will have a negative R Dix-Hallpike test, i.e. no c/o vertigo in test position, to indicate that R BPPV has resolved. (03-27-15)   Status On-going   PT LONG TERM GOAL #2   Title Improve Dizziness Handicap Index to </= 30 to indicate improved function/less vertigo.  (03-27-15)   Baseline 10% on 04-09-15   Status Achieved   PT LONG TERM GOAL #3   Title Independent in HEP for habituation if needed, i.e. Brandt-Daroff exercises.  (03-27-15)   Baseline met 04-09-15   Status Achieved               Plan - 04/12/15 0659    Clinical Impression Statement No nystagmus observed with any positional testing but pt cont to report some mild vertigo with R sit to sidelying and with return to upright position -  difficult to determine if true vertigo or some light-headedness due to change in blood pressure   Pt will benefit from skilled therapeutic intervention in order to improve on the following deficits Dizziness;Decreased balance   Rehab Potential Good   PT Frequency 1x / week   PT Duration 4 weeks   PT Treatment/Interventions ADLs/Self Care Home Management;Canalith Repostioning;Functional mobility training;Gait training;Therapeutic activities;Therapeutic exercise;Balance training;Neuromuscular re-education;Patient/family education;Vestibular   PT Next Visit Plan place on HOLD for 4 weeks - pt to call for appt should vertigo get worse - if not will D/C   PT Home Exercise Plan habituation for sit to right sidelying with head turn toward R side   Consulted and Agree with Plan of Care Patient         Problem List There are no active problems to display for this patient.   Dilday, Linda Suzanne, PT 04/12/2015, 7:05 AM  [facility] [address] [place], [place], [address] Phone: [phone]   Fax:  [phone]  Name: Ronald Campbell MRN: [id number] Date of Birth: 10/23/1970     

## 2015-04-29 ENCOUNTER — Encounter: Payer: Self-pay | Admitting: Neurology

## 2015-04-29 ENCOUNTER — Ambulatory Visit (INDEPENDENT_AMBULATORY_CARE_PROVIDER_SITE_OTHER): Payer: BLUE CROSS/BLUE SHIELD | Admitting: Neurology

## 2015-04-29 VITALS — BP 142/84 | HR 78 | Ht 72.0 in | Wt 237.5 lb

## 2015-04-29 DIAGNOSIS — R42 Dizziness and giddiness: Secondary | ICD-10-CM

## 2015-04-29 DIAGNOSIS — G441 Vascular headache, not elsewhere classified: Secondary | ICD-10-CM

## 2015-04-29 DIAGNOSIS — R519 Headache, unspecified: Secondary | ICD-10-CM

## 2015-04-29 DIAGNOSIS — R51 Headache: Secondary | ICD-10-CM

## 2015-04-29 HISTORY — DX: Headache, unspecified: R51.9

## 2015-04-29 HISTORY — DX: Dizziness and giddiness: R42

## 2015-04-29 MED ORDER — TOPIRAMATE 25 MG PO TABS: ORAL_TABLET | ORAL | Status: AC

## 2015-04-29 NOTE — Progress Notes (Signed)
Reason for visit: Vertigo, headache  Referring physician: Dr. Melton Krebs Mirabal is a 44 y.o. male  History of present illness:  Ronald Campbell is a 44 year old right-handed white male with a history of onset of vertigo that began while at work on 02/08/2015. The patient had vertigo for about 4 hours, with good resolution at that time. The patient felt initially had heat exhaustion, and he rested over the weekend. He had recurrence of the same vertigo when he returned back to work on August 15, and another episode on August 18. The patient stayed out of work for several days. The patient got extreme dizziness that began on August 20th working in his garage. The patient did have an elevation in blood pressure, he went to the emergency room and MRI brain evaluation was done and did not show any significant brain abnormalities. He went to Dr. Annalee Genta from ENT, but no etiology of the dizziness was noted. The patient was sent to physical therapy for vestibular rehabilitation, and the patient has gained some benefit. With the severe vertigo starting on August 20, the patient was unable to work for about 4 weeks. The vertigo has gradually improved, but has not gone away. The patient may get brief episode of dizziness if he looks up or changes head position rapidly. He has developed daily headaches that are in the frontal regions that have occurred since he began having episodes of dizziness. The patient does have a history of migraine headaches, but his headaches only occur several times a year. These headaches are different from his usual migraine. Initial episodes of vertigo were associated nausea and vomiting. The patient denies any neck stiffness, numbness or weakness on the extremities. The patient denies any hearing loss, he does have some mild ringing in the ears. He is sent to this office for an evaluation.  Past Medical History  Diagnosis Date  . Mood disorder (HCC)   . Guillain Barr syndrome (HCC)     . Vertigo 04/29/2015  . Headache 04/29/2015    Past Surgical History  Procedure Laterality Date  . Hernia repair Right     inguinal    Family History  Problem Relation Age of Onset  . Celiac disease Sister   . CAD Maternal Grandfather     Social history:  reports that he has never smoked. He has never used smokeless tobacco. He reports that he does not drink alcohol or use illicit drugs.  Medications:  Prior to Admission medications   Medication Sig Start Date End Date Taking? Authorizing Provider  Ascorbic Acid (VITAMIN C) 100 MG tablet Take 100 mg by mouth daily.   Yes Historical Provider, MD  escitalopram (LEXAPRO) 5 MG tablet Take 5 mg by mouth daily.   Yes Historical Provider, MD  lisinopril (PRINIVIL,ZESTRIL) 10 MG tablet Take 10 mg by mouth daily.   Yes Historical Provider, MD  Multiple Vitamin (MULTIVITAMIN) tablet Take 1 tablet by mouth daily.   Yes Historical Provider, MD  vitamin E 100 UNIT capsule Take 100 Units by mouth daily.   Yes Historical Provider, MD     No Known Allergies  ROS:  Out of a complete 14 system review of symptoms, the patient complains only of the following symptoms, and all other reviewed systems are negative.  Dizziness Achy muscles Headache   Blood pressure 142/84, pulse 78, height 6' (1.829 m), weight 237 lb 8 oz (107.729 kg).  Physical Exam  General: The patient is alert and cooperative at the  time of the examination.  Eyes: Pupils are equal, round, and reactive to light. Discs are flat bilaterally.  Ears: Tympanic membranes are clear bilaterally.  Neck: The neck is supple, no carotid bruits are noted.  Respiratory: The respiratory examination is clear.  Cardiovascular: The cardiovascular examination reveals a regular rate and rhythm, no obvious murmurs or rubs are noted.  Neuromuscular: Range of movement of the cervical spine was full  Skin: Extremities are without significant edema.  Neurologic Exam  Mental status:  The patient is alert and oriented x 3 at the time of the examination. The patient has apparent normal recent and remote memory, with an apparently normal attention span and concentration ability.  Cranial nerves: Facial symmetry is present. There is good sensation of the face to pinprick and soft touch bilaterally. The strength of the facial muscles and the muscles to head turning and shoulder shrug are normal bilaterally. Speech is well enunciated, no aphasia or dysarthria is noted. Extraocular movements are full. Visual fields are full. The tongue is midline, and the patient has symmetric elevation of the soft palate. No obvious hearing deficits are noted.  Motor: The motor testing reveals 5 over 5 strength of all 4 extremities. Good symmetric motor tone is noted throughout.  Sensory: Sensory testing is intact to pinprick, soft touch, vibration sensation, and position sense on all 4 extremities. No evidence of extinction is noted.  Coordination: Cerebellar testing reveals good finger-nose-finger and heel-to-shin bilaterally. The Nyan-Barrany procedure was negative.  Gait and station: Gait is normal. Tandem gait is normal. Romberg is negative. No drift is seen.  Reflexes: Deep tendon reflexes are symmetric and normal bilaterally. Toes are downgoing bilaterally.   Assessment/Plan:  1. Vertigo  2. Headache  3. History of migraine  The patient does have a history of migraine headaches, he has now developed daily headaches, and episodes of vertigo. The patient has had MRI evaluation of the brain that was relatively unremarkable. The patient may have responded to vestibular rehabilitation, the dizziness is much better than it had been, but the headaches remain daily. The patient will be given a trial on Topamax, and he will follow-up in 2-3 months. He will contact me if he is not doing well on the medication.   Marlan Palau. Keith Willis MD 04/29/2015 8:09 PM  [facility] [address] [place], [place] [id number]  Phone [phone] Fax [phone]

## 2015-04-29 NOTE — Patient Instructions (Addendum)
We will try Topamax for the headache and vertigo.  Topamax (topiramate) is a seizure medication that has an FDA approval for seizures and for migraine headache. Potential side effects of this medication include weight loss, cognitive slowing, tingling in the fingers and toes, and carbonated drinks will taste bad. If any significant side effects are noted on this drug, please contact our office.  Dizziness Dizziness is a common problem. It is a feeling of unsteadiness or light-headedness. You may feel like you are about to faint. Dizziness can lead to injury if you stumble or fall. Anyone can become dizzy, but dizziness is more common in older adults. This condition can be caused by a number of things, including medicines, dehydration, or illness. HOME CARE INSTRUCTIONS Taking these steps may help with your condition: Eating and Drinking  Drink enough fluid to keep your urine clear or pale yellow. This helps to keep you from becoming dehydrated. Try to drink more clear fluids, such as water.  Do not drink alcohol.  Limit your caffeine intake if directed by your health care provider.  Limit your salt intake if directed by your health care provider. Activity  Avoid making quick movements.  Rise slowly from chairs and steady yourself until you feel okay.  In the morning, first sit up on the side of the bed. When you feel okay, stand slowly while you hold onto something until you know that your balance is fine.  Move your legs often if you need to stand in one place for a long time. Tighten and relax your muscles in your legs while you are standing.  Do not drive or operate heavy machinery if you feel dizzy.  Avoid bending down if you feel dizzy. Place items in your home so that they are easy for you to reach without leaning over. Lifestyle  Do not use any tobacco products, including cigarettes, chewing tobacco, or electronic cigarettes. If you need help quitting, ask your health care  provider.  Try to reduce your stress level, such as with yoga or meditation. Talk with your health care provider if you need help. General Instructions  Watch your dizziness for any changes.  Take medicines only as directed by your health care provider. Talk with your health care provider if you think that your dizziness is caused by a medicine that you are taking.  Tell a friend or a family member that you are feeling dizzy. If he or she notices any changes in your behavior, have this person call your health care provider.  Keep all follow-up visits as directed by your health care provider. This is important. SEEK MEDICAL CARE IF:  Your dizziness does not go away.  Your dizziness or light-headedness gets worse.  You feel nauseous.  You have reduced hearing.  You have new symptoms.  You are unsteady on your feet or you feel like the room is spinning. SEEK IMMEDIATE MEDICAL CARE IF:  You vomit or have diarrhea and are unable to eat or drink anything.  You have problems talking, walking, swallowing, or using your arms, hands, or legs.  You feel generally weak.  You are not thinking clearly or you have trouble forming sentences. It may take a friend or family member to notice this.  You have chest pain, abdominal pain, shortness of breath, or sweating.  Your vision changes.  You notice any bleeding.  You have a headache.  You have neck pain or a stiff neck.  You have a fever.  This information is not intended to replace advice given to you by your health care provider. Make sure you discuss any questions you have with your health care provider.   Document Released: 12/09/2000 Document Revised: 10/30/2014 Document Reviewed: 06/11/2014 Elsevier Interactive Patient Education [employer].

## 2015-09-04 ENCOUNTER — Ambulatory Visit: Payer: BLUE CROSS/BLUE SHIELD | Admitting: Neurology

## 2015-12-21 DIAGNOSIS — M79672 Pain in left foot: Secondary | ICD-10-CM | POA: Diagnosis not present

## 2015-12-21 DIAGNOSIS — M79671 Pain in right foot: Secondary | ICD-10-CM | POA: Diagnosis not present

## 2015-12-21 DIAGNOSIS — M79641 Pain in right hand: Secondary | ICD-10-CM | POA: Diagnosis not present

## 2015-12-21 DIAGNOSIS — M7711 Lateral epicondylitis, right elbow: Secondary | ICD-10-CM | POA: Diagnosis not present

## 2016-01-06 DIAGNOSIS — M7710 Lateral epicondylitis, unspecified elbow: Secondary | ICD-10-CM | POA: Diagnosis not present

## 2016-01-20 DIAGNOSIS — M7711 Lateral epicondylitis, right elbow: Secondary | ICD-10-CM | POA: Diagnosis not present

## 2016-01-20 DIAGNOSIS — M7712 Lateral epicondylitis, left elbow: Secondary | ICD-10-CM | POA: Diagnosis not present

## 2016-02-14 DIAGNOSIS — M79671 Pain in right foot: Secondary | ICD-10-CM | POA: Diagnosis not present

## 2016-02-14 DIAGNOSIS — M7711 Lateral epicondylitis, right elbow: Secondary | ICD-10-CM | POA: Diagnosis not present

## 2016-02-14 DIAGNOSIS — M79672 Pain in left foot: Secondary | ICD-10-CM | POA: Diagnosis not present

## 2016-02-14 DIAGNOSIS — M7712 Lateral epicondylitis, left elbow: Secondary | ICD-10-CM | POA: Diagnosis not present

## 2016-03-16 DIAGNOSIS — M79671 Pain in right foot: Secondary | ICD-10-CM | POA: Diagnosis not present

## 2016-09-16 DIAGNOSIS — E291 Testicular hypofunction: Secondary | ICD-10-CM | POA: Diagnosis not present

## 2016-09-16 DIAGNOSIS — Z0000 Encounter for general adult medical examination without abnormal findings: Secondary | ICD-10-CM | POA: Diagnosis not present

## 2016-09-16 DIAGNOSIS — F411 Generalized anxiety disorder: Secondary | ICD-10-CM | POA: Diagnosis not present

## 2016-09-16 DIAGNOSIS — I10 Essential (primary) hypertension: Secondary | ICD-10-CM | POA: Diagnosis not present

## 2016-09-16 DIAGNOSIS — Z13220 Encounter for screening for lipoid disorders: Secondary | ICD-10-CM | POA: Diagnosis not present

## 2017-07-22 IMAGING — MR MR HEAD W/O CM
9 of 11 series · 34 of 48 positions shown · non-contrast
Comparison: CT sinuses July 27, 2007

CLINICAL DATA: Vertigo. Three significant episodes of dizziness
lasting 3 hours, typically postprandial. On Antivert for 1 day.
Hypertensive.

EXAM:
MRI HEAD WITHOUT CONTRAST
TECHNIQUE: Multiplanar, multiecho pulse sequences of the brain and surrounding
structures were obtained without intravenous contrast.

[Series 2: FLAIR · sagittal · 5.0mm · 0.47mm/px · 1 of 25 slices shown (1 of 2)]
[im 1/25]
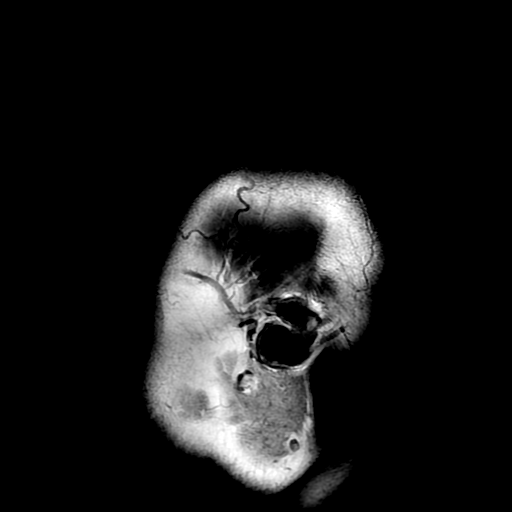

[Series 4: DWI · axial · 3.0mm · 0.94mm/px · z∈[-42,+105]mm · 9 of 100 slices shown (1 of 4)]
[im 1/100]
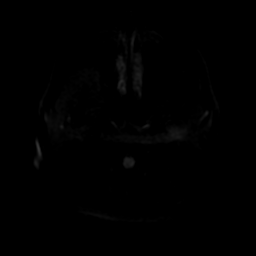
[im 13/100]
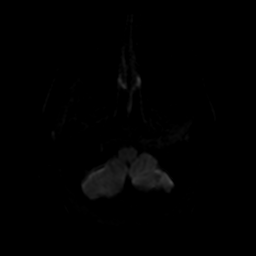
[im 25/100]
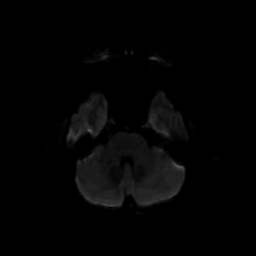
[im 38/100]
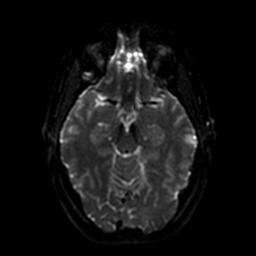
[im 50/100]
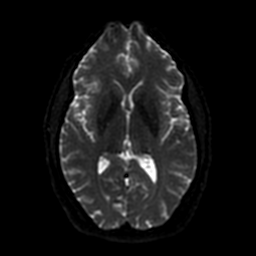
[im 62/100]
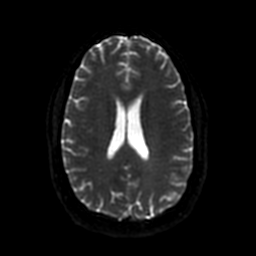
[im 75/100]
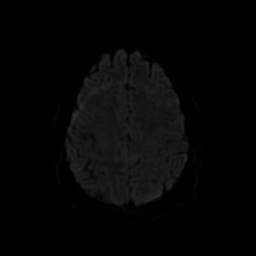
[im 87/100]
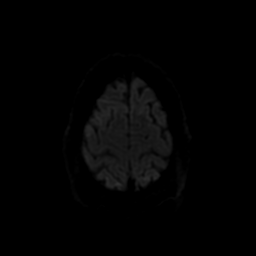
[im 100/100]
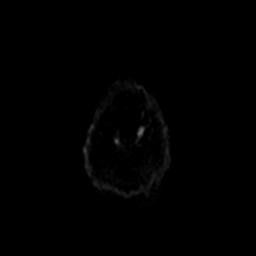

[Series 5: T2 · axial · 5.0mm · 0.47mm/px · z∈[-41,+103]mm · 2 of 25 slices shown (1 of 2)]
[im 1/25]
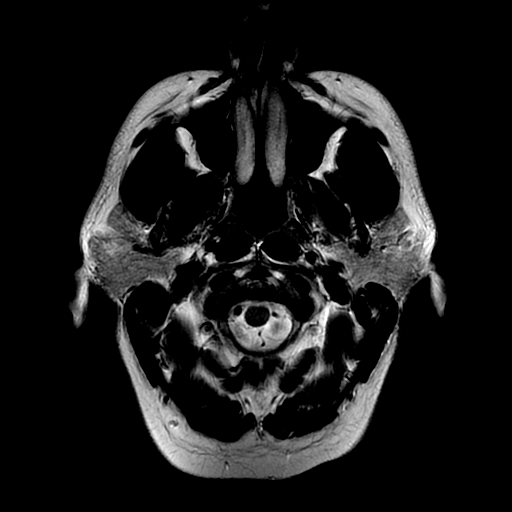
[im 25/25]
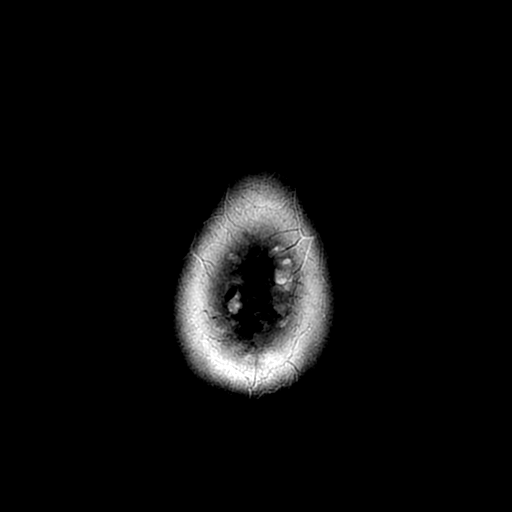

[Series 6: FLAIR · axial · 5.0mm · 0.47mm/px · z∈[-41,+103]mm · 2 of 25 slices shown (2 of 2)]
[im 1/25]
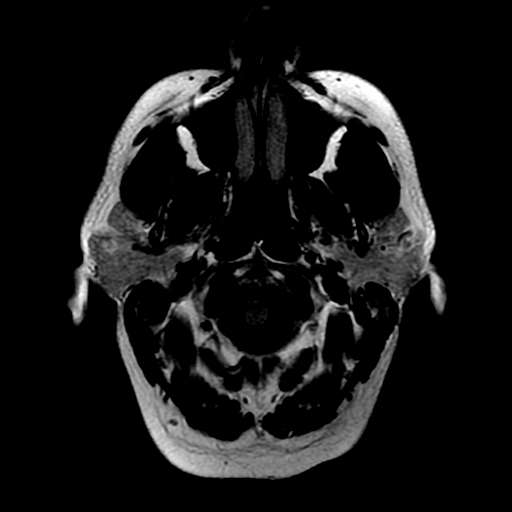
[im 25/25]
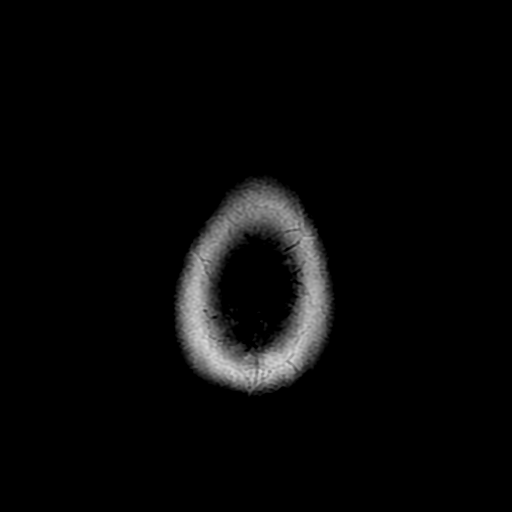

[Series 8: DWI · coronal · 5.0mm · 0.94mm/px · 6 of 72 slices shown (2 of 4)]
[im 1/72]
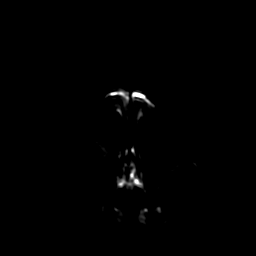
[im 15/72]
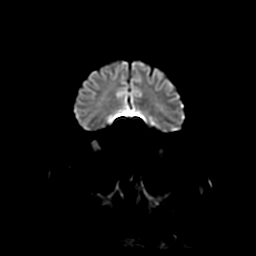
[im 29/72]
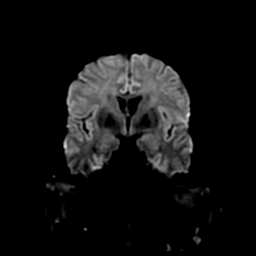
[im 43/72]
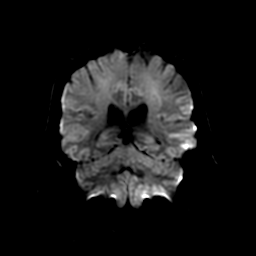
[im 57/72]
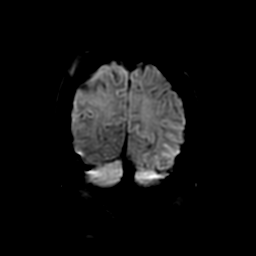
[im 72/72]
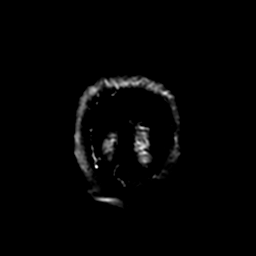

[Series 9: (person_name) · axial · 3.0mm · 0.47mm/px · z∈[-42,+13]mm · 4 of 100 slices shown]
[im 1/100]
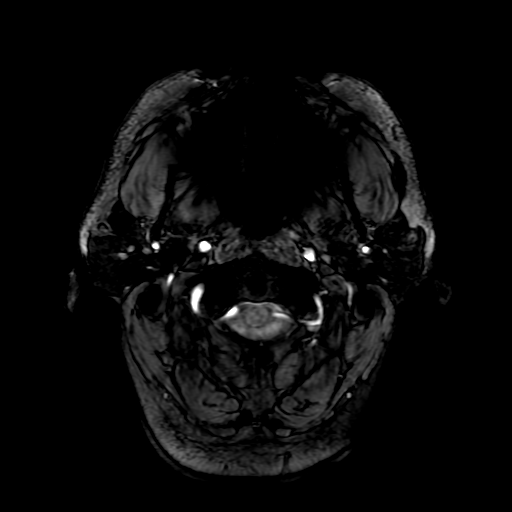
[im 13/100]
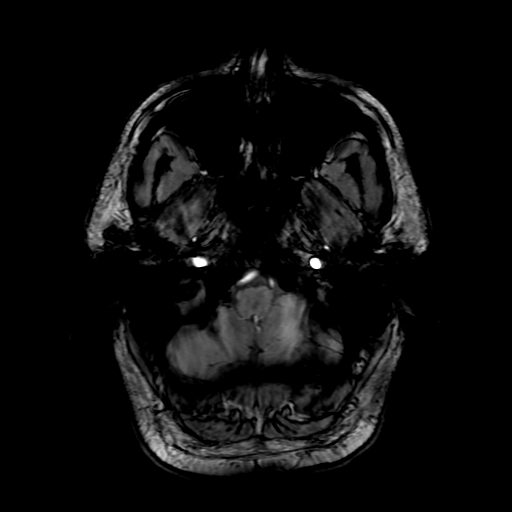
[im 25/100]
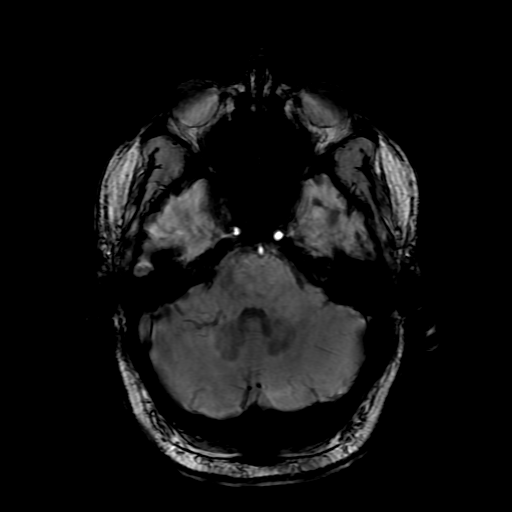
[im 38/100]
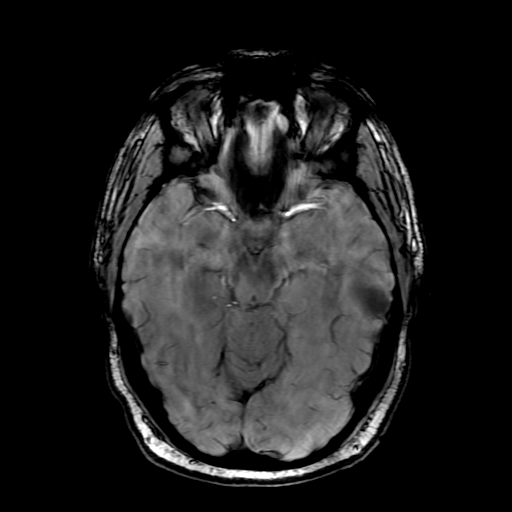

[Series 11: T2 · coronal · 5.0mm · 0.39mm/px · 3 of 30 slices shown (2 of 2)]
[im 1/30]
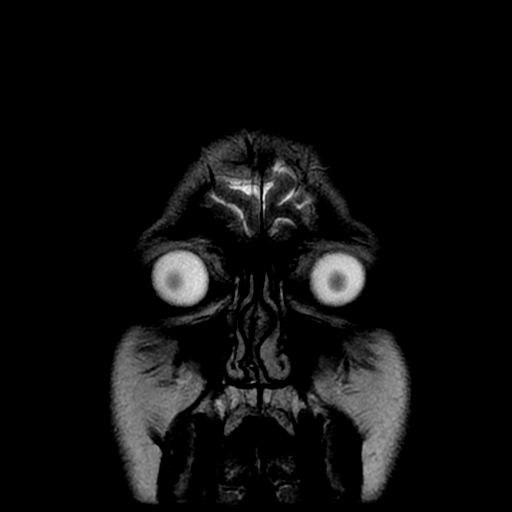
[im 15/30]
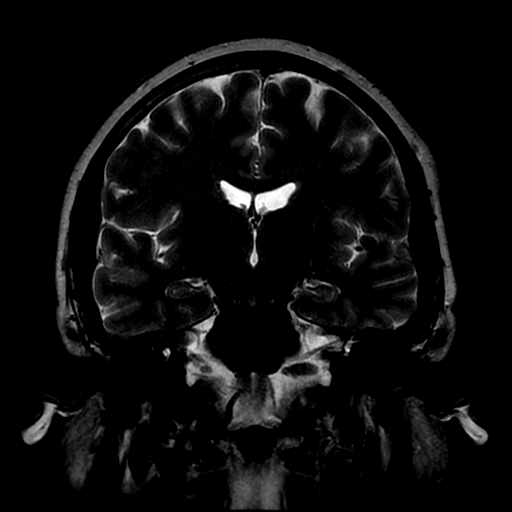
[im 30/30]
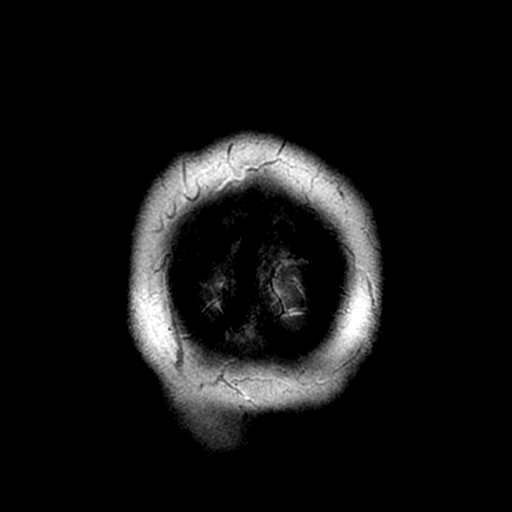

[Series 400: DWI · axial · 3.0mm · 0.94mm/px · z∈[-39,+105]mm · 4 of 49 slices shown (3 of 4)]
[im 1/49]
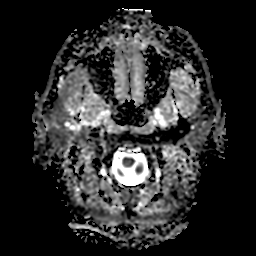
[im 17/49]
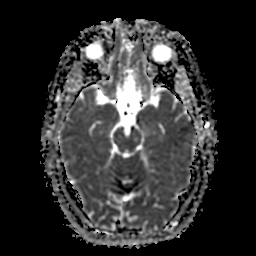
[im 33/49]
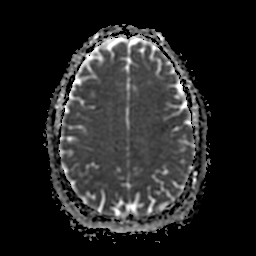
[im 49/49]
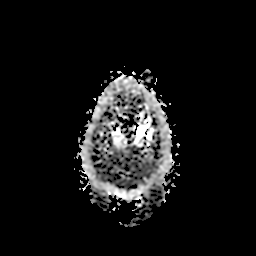

[Series 800: DWI · coronal · 5.0mm · 0.94mm/px · 3 of 36 slices shown (4 of 4)]
[im 1/36]
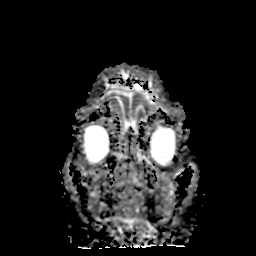
[im 18/36]
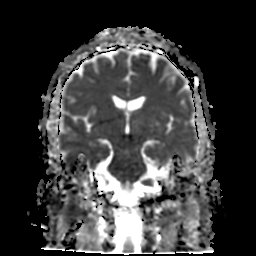
[im 36/36]
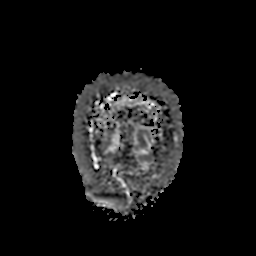

[34 of 48 positions shown; findings below may reference images not displayed]

FINDINGS: The ventricles and sulci are normal for patient's age. No abnormal
parenchymal signal, mass lesions, mass effect. No reduced diffusion
to suggest acute ischemia. No susceptibility artifact to suggest
hemorrhage.

No abnormal extra-axial fluid collections. No extra-axial masses
though, contrast enhanced sequences would be more sensitive. Normal
major intracranial vascular flow voids seen at the skull base. RIGHT
brachium pontis developmental venous anomaly, benign finding.

Ocular globes and orbital contents are unremarkable though not
tailored for evaluation. No abnormal sellar expansion. Visualized
paranasal sinuses and mastoid air cells are well-aerated. Bilateral
concha bullosa. No suspicious calvarial bone marrow signal. No
abnormal sellar expansion. Craniocervical junction maintained.
IMPRESSION: No acute intracranial process.

RIGHT brachium pontis developmental venous anomaly without
cavernoma. Otherwise unremarkable noncontrast MRI of the brain.

## 2017-09-16 DIAGNOSIS — Z0000 Encounter for general adult medical examination without abnormal findings: Secondary | ICD-10-CM | POA: Diagnosis not present

## 2017-09-16 DIAGNOSIS — E291 Testicular hypofunction: Secondary | ICD-10-CM | POA: Diagnosis not present

## 2017-09-16 DIAGNOSIS — I10 Essential (primary) hypertension: Secondary | ICD-10-CM | POA: Diagnosis not present

## 2017-09-16 DIAGNOSIS — Z23 Encounter for immunization: Secondary | ICD-10-CM | POA: Diagnosis not present

## 2017-09-16 DIAGNOSIS — E781 Pure hyperglyceridemia: Secondary | ICD-10-CM | POA: Diagnosis not present

## 2017-09-17 DIAGNOSIS — Z136 Encounter for screening for cardiovascular disorders: Secondary | ICD-10-CM | POA: Diagnosis not present

## 2017-09-17 DIAGNOSIS — E291 Testicular hypofunction: Secondary | ICD-10-CM | POA: Diagnosis not present

## 2018-12-09 DIAGNOSIS — Z125 Encounter for screening for malignant neoplasm of prostate: Secondary | ICD-10-CM | POA: Diagnosis not present

## 2018-12-09 DIAGNOSIS — Z13220 Encounter for screening for lipoid disorders: Secondary | ICD-10-CM | POA: Diagnosis not present

## 2018-12-09 DIAGNOSIS — K580 Irritable bowel syndrome with diarrhea: Secondary | ICD-10-CM | POA: Diagnosis not present

## 2018-12-09 DIAGNOSIS — I10 Essential (primary) hypertension: Secondary | ICD-10-CM | POA: Diagnosis not present

## 2018-12-09 DIAGNOSIS — Z6833 Body mass index (BMI) 33.0-33.9, adult: Secondary | ICD-10-CM | POA: Diagnosis not present

## 2018-12-09 DIAGNOSIS — Z131 Encounter for screening for diabetes mellitus: Secondary | ICD-10-CM | POA: Diagnosis not present

## 2018-12-09 DIAGNOSIS — Z0000 Encounter for general adult medical examination without abnormal findings: Secondary | ICD-10-CM | POA: Diagnosis not present

## 2018-12-09 DIAGNOSIS — E291 Testicular hypofunction: Secondary | ICD-10-CM | POA: Diagnosis not present

## 2018-12-09 DIAGNOSIS — F411 Generalized anxiety disorder: Secondary | ICD-10-CM | POA: Diagnosis not present

## 2018-12-13 DIAGNOSIS — E291 Testicular hypofunction: Secondary | ICD-10-CM | POA: Diagnosis not present

## 2020-01-09 DIAGNOSIS — Z0000 Encounter for general adult medical examination without abnormal findings: Secondary | ICD-10-CM | POA: Diagnosis not present

## 2020-01-09 DIAGNOSIS — F411 Generalized anxiety disorder: Secondary | ICD-10-CM | POA: Diagnosis not present

## 2020-01-09 DIAGNOSIS — I10 Essential (primary) hypertension: Secondary | ICD-10-CM | POA: Diagnosis not present

## 2020-01-09 DIAGNOSIS — M25511 Pain in right shoulder: Secondary | ICD-10-CM | POA: Diagnosis not present

## 2020-01-09 DIAGNOSIS — Z125 Encounter for screening for malignant neoplasm of prostate: Secondary | ICD-10-CM | POA: Diagnosis not present

## 2020-01-09 DIAGNOSIS — E291 Testicular hypofunction: Secondary | ICD-10-CM | POA: Diagnosis not present

## 2020-08-23 DIAGNOSIS — R1011 Right upper quadrant pain: Secondary | ICD-10-CM | POA: Diagnosis not present

## 2020-08-23 DIAGNOSIS — Z800 Family history of malignant neoplasm of digestive organs: Secondary | ICD-10-CM | POA: Diagnosis not present

## 2020-08-23 DIAGNOSIS — I10 Essential (primary) hypertension: Secondary | ICD-10-CM | POA: Diagnosis not present

## 2020-08-23 DIAGNOSIS — Z1211 Encounter for screening for malignant neoplasm of colon: Secondary | ICD-10-CM | POA: Diagnosis not present

## 2020-09-04 DIAGNOSIS — M25522 Pain in left elbow: Secondary | ICD-10-CM | POA: Diagnosis not present

## 2020-09-04 DIAGNOSIS — M25521 Pain in right elbow: Secondary | ICD-10-CM | POA: Diagnosis not present

## 2020-09-25 DIAGNOSIS — M25521 Pain in right elbow: Secondary | ICD-10-CM | POA: Diagnosis not present

## 2020-10-01 DIAGNOSIS — M25522 Pain in left elbow: Secondary | ICD-10-CM | POA: Diagnosis not present

## 2020-10-01 DIAGNOSIS — M25521 Pain in right elbow: Secondary | ICD-10-CM | POA: Diagnosis not present

## 2020-12-18 DIAGNOSIS — M7711 Lateral epicondylitis, right elbow: Secondary | ICD-10-CM | POA: Diagnosis not present

## 2023-04-30 ENCOUNTER — Emergency Department (HOSPITAL_BASED_OUTPATIENT_CLINIC_OR_DEPARTMENT_OTHER)
Admission: EM | Admit: 2023-04-30 | Discharge: 2023-04-30 | Disposition: A | Discharge status: Home / Self Care | Payer: 59 | Attending: Emergency Medicine | Admitting: Emergency Medicine

## 2023-04-30 ENCOUNTER — Emergency Department (HOSPITAL_BASED_OUTPATIENT_CLINIC_OR_DEPARTMENT_OTHER): Payer: 59

## 2023-04-30 ENCOUNTER — Encounter (HOSPITAL_BASED_OUTPATIENT_CLINIC_OR_DEPARTMENT_OTHER): Payer: Self-pay | Admitting: Urology

## 2023-04-30 DIAGNOSIS — R112 Nausea with vomiting, unspecified: Secondary | ICD-10-CM | POA: Insufficient documentation

## 2023-04-30 DIAGNOSIS — R197 Diarrhea, unspecified: Secondary | ICD-10-CM | POA: Insufficient documentation

## 2023-04-30 LAB — CBC
HCT: 40 % (ref 39.0–52.0)
Hemoglobin: 14.5 g/dL (ref 13.0–17.0)
MCH: 32.6 pg (ref 26.0–34.0)
MCHC: 36.3 g/dL — ABNORMAL HIGH (ref 30.0–36.0)
MCV: 89.9 fL (ref 80.0–100.0)
Platelets: 184 10*3/uL (ref 150–400)
RBC: 4.45 MIL/uL (ref 4.22–5.81)
RDW: 12.2 % (ref 11.5–15.5)
WBC: 5.8 10*3/uL (ref 4.0–10.5)
nRBC: 0 % (ref 0.0–0.2)

## 2023-04-30 LAB — LIPASE, BLOOD: Lipase: 37 U/L (ref 11–51)

## 2023-04-30 LAB — COMPREHENSIVE METABOLIC PANEL
ALT: 55 U/L — ABNORMAL HIGH (ref 0–44)
AST: 28 U/L (ref 15–41)
Albumin: 4.2 g/dL (ref 3.5–5.0)
Alkaline Phosphatase: 63 U/L (ref 38–126)
Anion gap: 7 (ref 5–15)
BUN: 20 mg/dL (ref 6–20)
CO2: 21 mmol/L — ABNORMAL LOW (ref 22–32)
Calcium: 8.4 mg/dL — ABNORMAL LOW (ref 8.9–10.3)
Chloride: 106 mmol/L (ref 98–111)
Creatinine, Ser: 0.86 mg/dL (ref 0.61–1.24)
GFR, Estimated: >60 mL/min (ref >60)
Glucose, Bld: 111 mg/dL — ABNORMAL HIGH (ref 70–99)
Potassium: 3.6 mmol/L (ref 3.5–5.1)
Sodium: 134 mmol/L — ABNORMAL LOW (ref 135–145)
Total Bilirubin: 1.1 mg/dL (ref 0.3–1.2)
Total Protein: 7.3 g/dL (ref 6.5–8.1)

## 2023-04-30 LAB — URINALYSIS, ROUTINE W REFLEX MICROSCOPIC
Bilirubin Urine: NEGATIVE
Glucose, UA: NEGATIVE mg/dL
Hgb urine dipstick: NEGATIVE
Ketones, ur: NEGATIVE mg/dL
Leukocytes,Ua: NEGATIVE
Nitrite: NEGATIVE
Protein, ur: NEGATIVE mg/dL
Specific Gravity, Urine: 1.01 (ref 1.005–1.030)
pH: 5.5 (ref 5.0–8.0)

## 2023-04-30 MED ORDER — ONDANSETRON HCL 4 MG PO TABS: 4.0000 mg | ORAL_TABLET | Freq: Four times a day (QID) | ORAL | 0 refills | Status: AC

## 2023-04-30 MED ORDER — SODIUM CHLORIDE 0.9 % IV BOLUS
1000.0000 mL | Freq: Once | INTRAVENOUS | Status: AC
  Administered 2023-04-30: 1000 mL via INTRAVENOUS

## 2023-04-30 MED ORDER — PANTOPRAZOLE SODIUM 40 MG IV SOLR
40.0000 mg | Freq: Once | INTRAVENOUS | Status: AC
  Administered 2023-04-30: 40 mg via INTRAVENOUS
  Filled 2023-04-30: qty 10

## 2023-04-30 MED ORDER — IOHEXOL 300 MG/ML  SOLN
100.0000 mL | Freq: Once | INTRAMUSCULAR | Status: AC | PRN
  Administered 2023-04-30: 100 mL via INTRAVENOUS

## 2023-04-30 NOTE — ED Notes (Signed)

## 2023-04-30 NOTE — ED Triage Notes (Signed)
Pt states 1 week ago had emesis x 1 and diarrhea extensively  States generalized abd pain  Stool is yellow liquid

## 2023-04-30 NOTE — ED Notes (Signed)
Pt informed on need for urine sample. Unable to give at this time; has cup when ready.

## 2023-04-30 NOTE — Discharge Instructions (Addendum)
Please follow-up with your primary care provider in regards to recent symptoms and ER visit.  Today your labs and imaging are reassuring and you most likely have a GI bug causing your symptoms.  CT scan does show they have an enlarged spleen and so while you are starting symptoms please avoid any activities that may result in you being hit in abdomen or left side of your belly as that is where your spleen is to avoid rupture.  Please eat enough fiber and you may take Zofran as prescribed for any nausea and you may use Imodium as well for diarrhea.  Please remain hydrated.  If you begin to have changes or worsening of symptoms please return to ER.

## 2023-04-30 NOTE — ED Provider Notes (Signed)
[place] [facility] Provider Note   CSN: [id number] Arrival date & time: 04/30/23  1248     History  Chief Complaint  Patient presents with   Diarrhea    Ronald Campbell is a 52 y.o. 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  Patient dates this occurred last week on Friday when she had a temperature of 100 F but soon resolved afterwards.  Patient states that he has had nonstop emesis along with 25-50 loose stools daily with mucus.  Patient has abdominal cramping and states that his appendix and gallbladder.  Patient does note that Imodium did help over-the-counter but that he was still having loose stools.  Patient denies chest pain, shortness of breath, sick contacts, hematemesis, hematochezia, recent antibiotic use or hospitalizations, recent travel, foods that have tasted funny or smelled funny, change in sensation/motor skills, numbness or weakness.  Home Medications Prior to Admission medications   Medication Sig Start Date End Date Taking? Authorizing Provider  ondansetron (ZOFRAN) 4 MG tablet Take 1 tablet (4 mg total) by mouth every 6 (six) hours. 04/30/23  Yes Netta Corrigan, PA-C  Ascorbic Acid (VITAMIN C) 100 MG tablet Take 100 mg by mouth daily.    [provider]  escitalopram (LEXAPRO) 5 MG tablet Take 5 mg by mouth daily.    [provider]  lisinopril (PRINIVIL,ZESTRIL) 10 MG tablet Take 10 mg by mouth daily.    [provider]  Multiple Vitamin (MULTIVITAMIN) tablet Take 1 tablet by mouth daily.    [provider]  topiramate (TOPAMAX) 25 MG tablet Take one tablet at night for one week, then take 2 tablets at night for one week, then take 3 tablets at night. 04/29/15   York Spaniel, MD  vitamin E 100 UNIT capsule Take 100 Units by mouth daily.    [provider]      Allergies    Patient has no known allergies.    Review of Systems    Review of Systems  Gastrointestinal:  Positive for diarrhea.    Physical Exam Updated Vital Signs BP 131/88 (BP Location: Right Arm)   Pulse 71   Temp 98.3 F (36.8 C) (Oral)   Resp 18   Ht 6' (1.829 m)   Wt 99.8 kg   SpO2 98%   BMI 29.84 kg/m  Physical Exam Vitals reviewed.  Constitutional:      General: He is not in acute distress. HENT:     Head: Normocephalic and atraumatic.     Mouth/Throat:     Mouth: Mucous membranes are dry.  Eyes:     Extraocular Movements: Extraocular movements intact.     Conjunctiva/sclera: Conjunctivae normal.     Pupils: Pupils are equal, round, and reactive to light.  Cardiovascular:     Rate and Rhythm: Normal rate and regular rhythm.     Pulses: Normal pulses.     Heart sounds: Normal heart sounds.     Comments: 2+ bilateral radial/dorsalis pedis pulses with regular rate Pulmonary:     Effort: Pulmonary effort is normal. No respiratory distress.     Breath sounds: Normal breath sounds.  Abdominal:     Palpations: Abdomen is soft.     Tenderness: There is no abdominal tenderness. There is no guarding or rebound.  Musculoskeletal:        General: Normal range of motion.     Cervical back: Normal range of motion and  neck supple.     Comments: 5 out of 5 bilateral grip/leg extension strength  Skin:    General: Skin is warm and dry.     Capillary Refill: Capillary refill takes less than 2 seconds.  Neurological:     General: No focal deficit present.     Mental Status: He is alert and oriented to person, place, and time.     Comments: Sensation intact in all 4 limbs  Psychiatric:        Mood and Affect: Mood normal.     ED Results / Procedures / Treatments   Labs (all labs ordered are listed, but only abnormal results are displayed) Labs Reviewed  COMPREHENSIVE METABOLIC PANEL - Abnormal; Notable for the following components:      Result Value   Sodium 134 (*)    CO2 21 (*)    Glucose, Bld 111 (*)    Calcium 8.4 (*)     ALT 55 (*)    All other components within normal limits  CBC - Abnormal; Notable for the following components:   MCHC 36.3 (*)    All other components within normal limits  URINALYSIS, ROUTINE W REFLEX MICROSCOPIC - Abnormal; Notable for the following components:   Color, Urine STRAW (*)    All other components within normal limits  LIPASE, BLOOD    EKG None  Radiology CT ABDOMEN PELVIS W CONTRAST  Result Date: 04/30/2023 CLINICAL DATA:  Vomiting, diarrhea, generalized abdominal pain EXAM: CT ABDOMEN AND PELVIS WITH CONTRAST TECHNIQUE: Multidetector CT imaging of the abdomen and pelvis was performed using the standard protocol following bolus administration of intravenous contrast. RADIATION DOSE REDUCTION: This exam was performed according to the departmental dose-optimization program which includes automated exposure control, adjustment of the mA and/or kV according to patient size and/or use of iterative reconstruction technique. CONTRAST:  OMNIPAQUE IOHEXOL 300 MG/ML  SOLN COMPARISON:  None Available. FINDINGS: Lower chest: No acute abnormality Hepatobiliary: No focal hepatic abnormality. Question subtle diffuse low-density throughout the liver suggesting early fatty infiltration. Gallbladder unremarkable. Pancreas: No focal abnormality or ductal dilatation. Spleen: Splenomegaly with a craniocaudal length of 15 cm. Adrenals/Urinary Tract: No adrenal abnormality. No focal renal abnormality. No stones or hydronephrosis. Urinary bladder is unremarkable. Stomach/Bowel: Normal appendix. Stomach, large and small bowel grossly unremarkable. Vascular/Lymphatic: No evidence of aneurysm or adenopathy. Reproductive: No visible focal abnormality. Other: No free fluid or free air. Musculoskeletal: No acute bony abnormality. Sclerotic focus within the left iliac bone, likely bone island. IMPRESSION: Splenomegaly. Question early hepatic steatosis. Otherwise no acute findings in the abdomen or pelvis.  Electronically Signed   By: Charlett Nose M.D.   On: 04/30/2023 17:12    Procedures Procedures    Medications Ordered in ED Medications  sodium chloride 0.9 % bolus 1,000 mL (0 mLs Intravenous Stopped 04/30/23 1740)  pantoprazole (PROTONIX) injection 40 mg (40 mg Intravenous Given 04/30/23 1340)  iohexol (OMNIPAQUE) 300 MG/ML solution 100 mL (100 mLs Intravenous Contrast Given 04/30/23 1411)    ED Course/ Medical Decision Making/ A&P                                 Medical Decision Making Amount and/or Complexity of Data Reviewed Labs: ordered. Radiology: ordered.  Risk Prescription drug management.   Ronald Campbell 52 y.o. presented today for diarrhea.  Working DDx that I considered at this time includes, but not limited to, electrolyte abnormalities, dehydration, Campylobacter,  Salmonella, EHEC, Shigella, Vibrio, Yersinia, IBD, Ischemic Colitis, Viral AGE, E. Coli, C. Diff, Nora/Rotavirus, Giardia, Listeria, Clostridium, IBD, IBS.  R/o DDx: electrolyte abnormalities, dehydration, Campylobacter, Salmonella, EHEC, Shigella, Vibrio, Yersinia, IBD, Ischemic Colitis, E. Coli, C. Diff, Nora/Rotavirus, Giardia, Listeria, Clostridium, IBD, IBS: These are considered less likely due to history of present illness, physical exam, lab/imaging findings  Review of prior external notes: 02/16/2015 ED  Unique Tests and My Interpretation:  CBC: Unremarkable CMP: Unremarkable Lipase: Unremarkable [id number] CT abdomen pelvis contrast: Splenomegaly, no other acute findings  Social Determinants of Health: none  Discussion with Independent Historian:  Family member  Discussion of Management of Tests: None  Risk: Medium: prescription drug management  Risk Stratification Score: None  Plan: On exam patient was no acute distress with stable vitals.  Patient's physical exam does show he is dry and so we will get some fluids along with Protonix.  Patient also did have generalized abdominal  tenderness without peritoneal signs.  Patient reported emesis however patient is more concerned about his diarrhea.  Will order abdominal labs and further monitor.  On recheck patient was doing well and was tolerating water that he was drinking at the bedside with no episodes of emesis while being in the ER for the past 5 hours.  Patient's labs are all reassuring and CT scan did not have any acute findings besides an enlarged spleen.  I spoke to patient about possible testing for mono however patient declined at this time.  Do suspect patient has GI bug given symptoms along with reassuring labs ordered patient to follow-up with his primary care provider.  Will give Zofran and encouraged him to use Imodium over-the-counter along with eating a diet high in fiber and remain hydrated follow-up with a primary care provider.  Patient was given return precautions. Patient stable for discharge at this time.  Patient verbalized understanding of plan.  This chart was dictated using voice recognition software.  Despite best efforts to proofread,  errors can occur which can change the documentation meaning.         Final Clinical Impression(s) / ED Diagnoses Final diagnoses:  Diarrhea, unspecified type    Rx / DC Orders ED Discharge Orders          Ordered    ondansetron (ZOFRAN) 4 MG tablet  Every 6 hours        04/30/23 1730              Remi Deter 04/30/23 1739    Tegeler, Canary Brim, MD 05/01/23 1523
